# Patient Record
Sex: Female | Born: 1975 | Race: White | Hispanic: No | Marital: Married | Smoking: Never smoker
Health system: Southern US, Community
[De-identification: ages and names within clinical notes are randomized; demographics above are authoritative.]

## PROBLEM LIST (undated history)

## (undated) DIAGNOSIS — K589 Irritable bowel syndrome without diarrhea: Secondary | ICD-10-CM

## (undated) HISTORY — DX: Irritable bowel syndrome without diarrhea: K58.9

---

## 2012-11-17 ENCOUNTER — Emergency Department (HOSPITAL_BASED_OUTPATIENT_CLINIC_OR_DEPARTMENT_OTHER)
Admission: EM | Admit: 2012-11-17 | Discharge: 2012-11-17 | Disposition: A | Discharge status: Home / Self Care | Payer: Worker's Compensation | Attending: Emergency Medicine | Admitting: Emergency Medicine

## 2012-11-17 ENCOUNTER — Encounter (HOSPITAL_BASED_OUTPATIENT_CLINIC_OR_DEPARTMENT_OTHER): Payer: Self-pay | Admitting: Emergency Medicine

## 2012-11-17 ENCOUNTER — Emergency Department (HOSPITAL_BASED_OUTPATIENT_CLINIC_OR_DEPARTMENT_OTHER): Payer: Worker's Compensation

## 2012-11-17 DIAGNOSIS — W010XXA Fall on same level from slipping, tripping and stumbling without subsequent striking against object, initial encounter: Secondary | ICD-10-CM | POA: Insufficient documentation

## 2012-11-17 DIAGNOSIS — S5011XA Contusion of right forearm, initial encounter: Secondary | ICD-10-CM

## 2012-11-17 DIAGNOSIS — Y939 Activity, unspecified: Secondary | ICD-10-CM | POA: Insufficient documentation

## 2012-11-17 DIAGNOSIS — Y929 Unspecified place or not applicable: Secondary | ICD-10-CM | POA: Insufficient documentation

## 2012-11-17 DIAGNOSIS — S5010XA Contusion of unspecified forearm, initial encounter: Secondary | ICD-10-CM | POA: Insufficient documentation

## 2012-11-17 NOTE — ED Notes (Signed)
Pt fell on ice.  Pt c/o pain right arm and elbow.  No LOC.

## 2012-11-17 NOTE — ED Provider Notes (Signed)
History     CSN: [id number]  Arrival date & time 11/17/12  1191   First MD Initiated Contact with Patient 11/17/12 [phone]      Chief Complaint  Patient presents with  . Fall    (Consider location/radiation/quality/duration/timing/severity/associated sxs/prior treatment) Patient is a 37 y.o. female presenting with fall. The history is provided by the patient.  Fall   patient complains of right elbow pain after slipping on ice this morning. No loss of consciousness. Pain is dull and worse with movement or palpation. Full range of motion at the elbow and patient notes some pain distal to her elbow. No numbness or tingling in her hand. No treatment used prior to arrival and symptoms have been persistent  No past medical history on file.  No past surgical history on file.  No family history on file.  History  Substance Use Topics  . Smoking status: Not on file  . Smokeless tobacco: Not on file  . Alcohol Use: Not on file    OB History   Grav Para Term Preterm Abortions TAB SAB Ect Mult Living                  Review of Systems  All other systems reviewed and are negative.    Allergies  Review of patient's allergies indicates no known allergies.  Home Medications   Current Outpatient Rx  Name  Route  Sig  Dispense  Refill  . norethindrone-ethinyl estradiol (JUNEL FE,GILDESS FE,LOESTRIN FE) 1-20 MG-MCG tablet   Oral   Take 1 tablet by mouth daily.           BP 124/81  Pulse 81  Temp(Src) 97.9 F (36.6 C) (Oral)  Resp 16  Ht 5\' 2"  (1.575 m)  Wt 225 lb (102.059 kg)  BMI 41.14 kg/m2  SpO2 99%  LMP 11/05/2012  Physical Exam  Nursing note and vitals reviewed. Constitutional: She is oriented to person, place, and time. She appears well-developed and well-nourished.  Non-toxic appearance. No distress.  HENT:  Head: Normocephalic and atraumatic.  Eyes: Conjunctivae, EOM and lids are normal. Pupils are equal, round, and reactive to light.  Neck: Normal range  of motion. Neck supple. No tracheal deviation present. No mass present.  Cardiovascular: Normal rate, regular rhythm and normal heart sounds.  Exam reveals no gallop.   No murmur heard. Pulmonary/Chest: Effort normal and breath sounds normal. No stridor. No respiratory distress. She has no decreased breath sounds. She has no wheezes. She has no rhonchi. She has no rales.  Abdominal: Soft. Normal appearance and bowel sounds are normal. She exhibits no distension. There is no tenderness. There is no rebound and no CVA tenderness.  Musculoskeletal: Normal range of motion. She exhibits no edema and no tenderness.       Right elbow: She exhibits no swelling and no deformity.       Arms: Neurological: She is alert and oriented to person, place, and time. She has normal strength. No cranial nerve deficit or sensory deficit. GCS eye subscore is 4. GCS verbal subscore is 5. GCS motor subscore is 6.  Skin: Skin is warm and dry. No abrasion and no rash noted.  Psychiatric: She has a normal mood and affect. Her speech is normal and behavior is normal.    ED Course  Procedures (including critical care time)  Labs Reviewed - No data to display No results found.   No diagnosis found.    MDM  X-rays negative. Patient with likely contusion  Toy Baker, MD 11/17/12 [phone]

## 2019-07-08 ENCOUNTER — Ambulatory Visit
Admission: EM | Admit: 2019-07-08 | Discharge: 2019-07-08 | Disposition: A | Discharge status: Home / Self Care | Payer: Commercial Managed Care - PPO | Attending: Family Medicine | Admitting: Family Medicine

## 2019-07-08 DIAGNOSIS — K591 Functional diarrhea: Secondary | ICD-10-CM | POA: Diagnosis not present

## 2019-07-08 MED ORDER — LOPERAMIDE HCL 2 MG PO CAPS: 2.0000 mg | ORAL_CAPSULE | Freq: Three times a day (TID) | ORAL | 0 refills | Status: AC

## 2019-07-08 MED ORDER — OXYMETAZOLINE HCL 0.05 % NA SOLN: 1.0000 | Freq: Two times a day (BID) | NASAL | Status: DC

## 2019-07-08 MED ORDER — DICYCLOMINE HCL 20 MG PO TABS: 20.0000 mg | ORAL_TABLET | Freq: Three times a day (TID) | ORAL | 0 refills | Status: AC

## 2019-07-08 MED ORDER — OXYMETAZOLINE HCL 0.05 % NA SOLN
2.0000 | Freq: Two times a day (BID) | NASAL | Status: DC
  Administered 2019-07-08: 2 via NASAL

## 2019-07-08 NOTE — ED Triage Notes (Signed)
Pt c/o diarrhea off and on for the past 27months. Last episode a week ago. No complaints at this time.

## 2019-07-08 NOTE — Discharge Instructions (Signed)
Please follow up with gastroenterology for further evaluation of diarrhea May try bentyl as needed for cramping/bloating Loperamide 2 mg 45 minutes before meals. Please read attached about FOD MAP diet, consider trying to eliminate these foods from diet  Follow up if developing suddenly worsening abdominal pain

## 2019-07-08 NOTE — ED Provider Notes (Signed)
[facility]    CSN: [id number] Arrival date & time: 07/08/19  1616      History   Chief Complaint Chief Complaint  Patient presents with  . Diarrhea    HPI Laura Newton is a 43 y.o. female no significant past medical history presenting today for evaluation of diarrhea.  Patient states that back in 2016 she struggled with constipation.  At the time she was seen by [facility] and had ultrasound and blood work.  Her symptoms were attributed to IBS and she was started on Linzess.  In 2018 she went on a cruise and started to have occasional episodes of diarrhea which she would often never noticed would happen when she goes out of town.  Over the past couple months she has also had occasional episodes of diarrhea.  Typically will wake her up at night and she will have frequent bowels for a few hours.  She will on rare occasions have associated vomiting, but notices this mainly when she has more pressure/gas sensation in her abdomen.  Has relatively mild associated abdominal pain.  Denies blood in stool.  Denies known family history of any GI issues or colon cancer.  Denies weight loss or night sweats.  Last episode of diarrhea was over 1 week ago.  Currently without abdominal pain.  Has noticed symptoms worse after eating [place] foods, occasionally symptoms seem to be situational.  HPI  History reviewed. No pertinent past medical history.  There are no active problems to display for this patient.   History reviewed. No pertinent surgical history.  OB History   No obstetric history on file.      Home Medications    Prior to Admission medications   Medication Sig Start Date End Date Taking? Authorizing Provider  dicyclomine (BENTYL) 20 MG tablet Take 1 tablet (20 mg total) by mouth 4 (four) times daily -  before meals and at bedtime. As needed for cramping/bloating 07/08/19   Deanna Boehlke, [employer] C, PA-C  loperamide (IMODIUM) 2 MG capsule Take 1 capsule (2 mg  total) by mouth 4 (four) times daily -  before meals and at bedtime. 07/08/19   Keene Gilkey C, PA-C  norethindrone-ethinyl estradiol (JUNEL FE,GILDESS FE,LOESTRIN FE) 1-20 MG-MCG tablet Take 1 tablet by mouth daily.    [provider]    Family History No family history on file.  Social History Social History   Tobacco Use  . Smoking status: Never Smoker  . Smokeless tobacco: Never Used  Substance Use Topics  . Alcohol use: Not Currently  . Drug use: Not on file     Allergies   Patient has no known allergies.   Review of Systems Review of Systems  Constitutional: Negative for fever.  Respiratory: Negative for shortness of breath.   Cardiovascular: Negative for chest pain.  Gastrointestinal: Positive for abdominal pain, diarrhea and nausea. Negative for vomiting.  Genitourinary: Positive for dysuria and vaginal discharge. Negative for flank pain, genital sores, hematuria, menstrual problem, vaginal bleeding and vaginal pain.  Musculoskeletal: Negative for back pain.  Skin: Negative for rash.  Neurological: Negative for dizziness, light-headedness and headaches.     Physical Exam Triage Vital Signs ED Triage Vitals [07/08/19 1625]  Enc Vitals Group     BP 125/89     Pulse Rate 91     Resp 18     Temp 98.1 F (36.7 C)     Temp Source Oral     SpO2 98 %  Weight      Height      Head Circumference      Peak Flow      Pain Score 0     Pain Loc      Pain Edu?      Excl. in GC?    No data found.  Updated Vital Signs BP 125/89 (BP Location: Left Arm)   Pulse 91   Temp 98.3 F (36.8 C) (Oral)   Resp 18   LMP 06/28/2019   SpO2 98%   Visual Acuity Right Eye Distance:   Left Eye Distance:   Bilateral Distance:    Right Eye Near:   Left Eye Near:    Bilateral Near:     Physical Exam Vitals signs and nursing note reviewed.  Constitutional:      General: She is not in acute distress.    Appearance: She is well-developed.  HENT:      Head: Normocephalic and atraumatic.  Eyes:     Conjunctiva/sclera: Conjunctivae normal.  Neck:     Musculoskeletal: Neck supple.  Cardiovascular:     Rate and Rhythm: Normal rate and regular rhythm.     Heart sounds: No murmur.  Pulmonary:     Effort: Pulmonary effort is normal. No respiratory distress.     Breath sounds: Normal breath sounds.     Comments: Breathing comfortably at rest, CTABL, no wheezing, rales or other adventitious sounds auscultated Abdominal:     Palpations: Abdomen is soft.     Tenderness: There is no abdominal tenderness.     Comments: Soft, nondistended, nontender to light and deep palpation throughout entire abdomen  Skin:    General: Skin is warm and dry.  Neurological:     Mental Status: She is alert.      UC Treatments / Results  Labs (all labs ordered are listed, but only abnormal results are displayed) Labs Reviewed - No data to display  EKG   Radiology No results found.  Procedures Procedures (including critical care time)  Medications Ordered in UC Medications - No data to display  Initial Impression / Assessment and Plan / UC Course  I have reviewed the triage vital signs and the nursing notes.  Pertinent labs & imaging results that were available during my care of the patient were reviewed by me and considered in my medical decision making (see chart for details).     Patient with episodic diarrhea over the past few months-years.  Likely more functional instead of infectious.  Symptoms most suggestive of IBS versus food intolerance.  Discussed following up with GI, discussed trial of FODMAP diet, may use Imodium as needed for diarrhea, Bentyl as needed for cramping/bloating.  Discussed strict return precautions. Patient verbalized understanding and is agreeable with plan.  Final Clinical Impressions(s) / UC Diagnoses   Final diagnoses:  Functional diarrhea     Discharge Instructions     Please follow up with  gastroenterology for further evaluation of diarrhea May try bentyl as needed for cramping/bloating Loperamide 2 mg 45 minutes before meals. Please read attached about FOD MAP diet, consider trying to eliminate these foods from diet  Follow up if developing suddenly worsening abdominal pain    ED Prescriptions    Medication Sig Dispense Auth. Provider   dicyclomine (BENTYL) 20 MG tablet Take 1 tablet (20 mg total) by mouth 4 (four) times daily -  before meals and at bedtime. As needed for cramping/bloating 20 tablet Inessa Wardrop, [place] C, PA-C  loperamide (IMODIUM) 2 MG capsule Take 1 capsule (2 mg total) by mouth 4 (four) times daily -  before meals and at bedtime. 24 capsule Jilliam Bellmore, [place] C, PA-C     PDMP not reviewed this encounter.   Lew DawesWieters, Fatemah Pourciau C, [place]PA-C 07/08/19 1907

## 2019-07-18 ENCOUNTER — Other Ambulatory Visit (INDEPENDENT_AMBULATORY_CARE_PROVIDER_SITE_OTHER): Payer: Commercial Managed Care - PPO

## 2019-07-18 ENCOUNTER — Ambulatory Visit: Payer: Commercial Managed Care - PPO | Admitting: Physician Assistant

## 2019-07-18 ENCOUNTER — Other Ambulatory Visit: Payer: Self-pay

## 2019-07-18 ENCOUNTER — Telehealth: Payer: Self-pay | Admitting: Physician Assistant

## 2019-07-18 ENCOUNTER — Encounter: Payer: Self-pay | Admitting: Physician Assistant

## 2019-07-18 VITALS — BP 116/80 | HR 76 | Temp 98.6°F | Ht 60.5 in | Wt 218.2 lb

## 2019-07-18 DIAGNOSIS — R197 Diarrhea, unspecified: Secondary | ICD-10-CM | POA: Diagnosis not present

## 2019-07-18 DIAGNOSIS — R1011 Right upper quadrant pain: Secondary | ICD-10-CM

## 2019-07-18 DIAGNOSIS — R112 Nausea with vomiting, unspecified: Secondary | ICD-10-CM

## 2019-07-18 LAB — CBC WITH DIFFERENTIAL/PLATELET
Basophils Absolute: 0.1 10*3/uL (ref 0.0–0.1)
Basophils Relative: 1.1 % (ref 0.0–3.0)
Eosinophils Absolute: 0 10*3/uL (ref 0.0–0.7)
Eosinophils Relative: 0.6 % (ref 0.0–5.0)
HCT: 38.7 % (ref 36.0–46.0)
Hemoglobin: 12.8 g/dL (ref 12.0–15.0)
Lymphocytes Relative: 42.6 % (ref 12.0–46.0)
Lymphs Abs: 2.4 10*3/uL (ref 0.7–4.0)
MCHC: 33.1 g/dL (ref 30.0–36.0)
MCV: 88.9 fl (ref 78.0–100.0)
Monocytes Absolute: 0.3 10*3/uL (ref 0.1–1.0)
Monocytes Relative: 5.3 % (ref 3.0–12.0)
Neutro Abs: 2.8 10*3/uL (ref 1.4–7.7)
Neutrophils Relative %: 50.4 % (ref 43.0–77.0)
Platelets: 329 10*3/uL (ref 150.0–400.0)
RBC: 4.36 Mil/uL (ref 3.87–5.11)
RDW: 13 % (ref 11.5–15.5)
WBC: 5.6 10*3/uL (ref 4.0–10.5)

## 2019-07-18 LAB — LIPASE: Lipase: 17 U/L (ref 11.0–59.0)

## 2019-07-18 LAB — COMPREHENSIVE METABOLIC PANEL
ALT: 9 U/L (ref 0–35)
AST: 13 U/L (ref 0–37)
Albumin: 4.1 g/dL (ref 3.5–5.2)
Alkaline Phosphatase: 61 U/L (ref 39–117)
BUN: 11 mg/dL (ref 6–23)
CO2: 25 mEq/L (ref 19–32)
Calcium: 9 mg/dL (ref 8.4–10.5)
Chloride: 105 mEq/L (ref 96–112)
Creatinine, Ser: 0.86 mg/dL (ref 0.40–1.20)
GFR: 72.74 mL/min (ref >60.00)
Glucose, Bld: 103 mg/dL — ABNORMAL HIGH (ref 70–99)
Potassium: 3.6 mEq/L (ref 3.5–5.1)
Sodium: 138 mEq/L (ref 135–145)
Total Bilirubin: 0.3 mg/dL (ref 0.2–1.2)
Total Protein: 7.4 g/dL (ref 6.0–8.3)

## 2019-07-18 NOTE — Progress Notes (Signed)
Chief Complaint: Recurrent diarrhea, nausea,vomiting  HPI:    Laura Newton is a 43 year old female with a past medical history as listed below, who was referred to me by Lew Dawes, PA-C for a complaint of recurrent diarrhea, nausea, vomiting.      07/08/2019 patient seen in the urgent care for functional diarrhea.  At that time described that back in 2016 she struggled with constipation and symptoms were attributed to IBS and she is started on Linzess.  In 2018 she went on a cruise and had several episodes of diarrhea and then over the past couple of months it also had occasional episodes of diarrhea which would wake her up at night and she will have frequent stools for a few hours, on rare occasions associated with vomiting but notices this mainly when she has more pressure/gas sensation in her abdomen, relatively mild associated abdominal pain.  That time discussed patient have episodic diarrhea over the past few months-years, thought more likely functional instead of infectious.  They discussed trial of FODMAP diet and Imodium as needed as well as Bentyl for cramping/bloating.    Today, the patient presents to clinic and explains that in 2018 she got sick on a cruise ship with vomiting and diarrhea but then when she came home this continued for the next 1 to 2 weeks.  Apparently had a work-up at that time including an ultrasound and labs but was never told the results of these.  She did not have another problem with that until now, at the beginning of the year it all started again and now every week or every other week or maybe every third week she will have an episode where she wakes up in the middle of the night with 8-9/10 abdominal cramping pain which sends her to the bathroom.  At first she will have a regular solid stool but then this is followed by watery stool for the next 1 to 2 hours.  Accompanied with this is nausea and multiple episodes of vomiting.  Patient tells me the pain almost  feels like an air bubble and pressure that is moving through her gut.  Does often start at the top of her abdomen and then radiate downward.  Has noticed that this is typically after she eats out, has happened multiple times after eating [place] food and one time after eating burgers.  After the few hours when this is going on a will stop and she will be fine the next day.    Denies fever, chills, weight loss, blood in her stool or frequent reflux symptoms.  Past Medical History:  Diagnosis Date   IBS (irritable bowel syndrome)     History reviewed. No pertinent surgical history.  Current Outpatient Medications  Medication Sig Dispense Refill   norethindrone-ethinyl estradiol (JUNEL FE,GILDESS FE,LOESTRIN FE) 1-20 MG-MCG tablet Take 1 tablet by mouth daily.     dicyclomine (BENTYL) 20 MG tablet Take 1 tablet (20 mg total) by mouth 4 (four) times daily -  before meals and at bedtime. As needed for cramping/bloating (Patient not taking: Reported on 07/18/2019) 20 tablet 0   loperamide (IMODIUM) 2 MG capsule Take 1 capsule (2 mg total) by mouth 4 (four) times daily -  before meals and at bedtime. (Patient not taking: Reported on 07/18/2019) 24 capsule 0   No current facility-administered medications for this visit.     Allergies as of 07/18/2019   (No Known Allergies)    Family History  Problem Relation Age  of Onset   Diabetes Sister     Social History   Socioeconomic History   Marital status: Married    Spouse name: Not on file   Number of children: 0   Years of education: Not on file   Highest education level: Not on file  Occupational History   Occupation: Dialysis Tech  Social Needs   Financial resource strain: Not on file   Food insecurity    Worry: Not on file    Inability: Not on file   Transportation needs    Medical: Not on file    Non-medical: Not on file  Tobacco Use   Smoking status: Never Smoker   Smokeless tobacco: Never Used  Substance and  Sexual Activity   Alcohol use: Not Currently   Drug use: Not on file   Sexual activity: Not on file  Lifestyle   Physical activity    Days per week: Not on file    Minutes per session: Not on file   Stress: Not on file  Relationships   Social connections    Talks on phone: Not on file    Gets together: Not on file    Attends religious service: Not on file    Active member of club or organization: Not on file    Attends meetings of clubs or organizations: Not on file    Relationship status: Not on file   Intimate partner violence    Fear of current or ex partner: Not on file    Emotionally abused: Not on file    Physically abused: Not on file    Forced sexual activity: Not on file  Other Topics Concern   Not on file  Social History Narrative   Not on file    Review of Systems:    Constitutional: No weight loss, fever or chills Skin: No rash  Cardiovascular: No chest pain Respiratory: No SOB Gastrointestinal: See HPI and otherwise negative Genitourinary: No dysuria  Neurological: No headache, dizziness or syncope Musculoskeletal: No new muscle or joint pain Hematologic: No bleeding Psychiatric: No history of depression or anxiety   Physical Exam:  Vital signs: BP 116/80 (BP Location: Left Arm, Patient Position: Sitting, Cuff Size: Normal)    Pulse 76    Temp 98.6 F (37 C)    Ht 5' 0.5" (1.537 m) Comment: height measured without shoes   Wt 218 lb 4 oz (99 kg)    LMP 07/16/2019    BMI 41.92 kg/m   Constitutional:   Pleasant obese female appears to be in NAD, Well developed, Well nourished, alert and cooperative Head:  Normocephalic and atraumatic. Eyes:   PEERL, EOMI. No icterus. Conjunctiva pink. Ears:  Normal auditory acuity. Neck:  Supple Throat: Oral cavity and pharynx without inflammation, swelling or lesion.  Respiratory: Respirations even and unlabored. Lungs clear to auscultation bilaterally.   No wheezes, crackles, or rhonchi.  Cardiovascular:  Normal S1, S2. No MRG. Regular rate and rhythm. No peripheral edema, cyanosis or pallor.  Gastrointestinal:  Soft, nondistended, nontender. No rebound or guarding. Normal bowel sounds. No appreciable masses or hepatomegaly. Rectal:  Not performed.  Msk:  Symmetrical without gross deformities. Without edema, no deformity or joint abnormality.  Neurologic:  Alert and  oriented x4;  grossly normal neurologically.  Skin:   Dry and intact without significant lesions or rashes. Psychiatric: Demonstrates good judgement and reason without abnormal affect or behaviors.  No recent labs or imaging.  Assessment: 1.  Right upper quadrant abdominal pain:  Patient states abdominal pain starts in her right upper quadrant during these episodes and radiates throughout her abdomen, typically after a fatty meal; concern for gallbladder etiology versus gastritis versus pancreatitis versus colitis versus IBS 2.  Diarrhea: With episodes above  3.  Nausea and vomiting: With episodes above  Plan: 1.  Discussed with patient that it sounds as though she is having episodes of symptomatic cholelithiasis.  Ordered a right upper quadrant ultrasound, CBC, CMP and lipase for further evaluation. 2.  Explained that if above is normal could consider a HIDA scan versus EGD/colonoscopy for further work-up. 3.  Advised the patient to maintain a low-fat diet for now 4.  Patient to follow in clinic for recommendations from us after testing above.  She was assigned to Dr. Marina GoodellPerry this afternoon.  Hyacinth MeekerJennifer Lenny Bouchillon, PA-C [facility] 07/18/2019, 1:53 PM  Cc: Wieters, ReaganHallie C, PA-C

## 2019-07-18 NOTE — Patient Instructions (Signed)
If you are age 41 or older, your body mass index should be between 23-30. Your Body mass index is 41.92 kg/m. If this is out of the aforementioned range listed, please consider follow up with your Primary Care Provider.  If you are age 37 or younger, your body mass index should be between 19-25. Your Body mass index is 41.92 kg/m. If this is out of the aformentioned range listed, please consider follow up with your Primary Care Provider.   Your provider has requested that you go to the basement level for lab work before leaving today. Press "B" on the elevator. The lab is located at the first door on the left as you exit the elevator.   You have been scheduled for an abdominal ultrasound at [facility] (1st floor of hospital) on Wednesday 07/24/2019 at 8 am. Please arrive 15 minutes prior to your appointment for registration. Make certain not to have anything to eat or drink 6 hours prior to your appointment. Should you need to reschedule your appointment, please contact radiology at [phone]. This test typically takes about 30 minutes to perform.

## 2019-07-18 NOTE — Progress Notes (Signed)
Assessment and plan noted ?

## 2019-07-19 NOTE — Telephone Encounter (Signed)
Called and left message to please keep her [place] appt. For 07/23/19 and to please call back if any other questions

## 2019-07-23 ENCOUNTER — Other Ambulatory Visit: Payer: Self-pay

## 2019-07-23 ENCOUNTER — Ambulatory Visit (HOSPITAL_COMMUNITY)
Admission: RE | Admit: 2019-07-23 | Discharge: 2019-07-23 | Disposition: A | Discharge status: Home / Self Care | Payer: Commercial Managed Care - PPO | Source: Ambulatory Visit | Attending: Physician Assistant | Admitting: Physician Assistant

## 2019-07-23 ENCOUNTER — Telehealth: Payer: Self-pay

## 2019-07-23 DIAGNOSIS — R1011 Right upper quadrant pain: Secondary | ICD-10-CM | POA: Insufficient documentation

## 2019-07-23 DIAGNOSIS — R112 Nausea with vomiting, unspecified: Secondary | ICD-10-CM | POA: Diagnosis present

## 2019-07-23 DIAGNOSIS — R197 Diarrhea, unspecified: Secondary | ICD-10-CM | POA: Insufficient documentation

## 2019-07-23 DIAGNOSIS — R935 Abnormal findings on diagnostic imaging of other abdominal regions, including retroperitoneum: Secondary | ICD-10-CM

## 2019-07-23 NOTE — Telephone Encounter (Signed)
Pt returned your call and would like a call back. She is also requesting to speak with Anderson Malta, she would like to know if her ct scan can wait until January of next year or if it is urgent. She would a like a better understanding of her situation and the treatment plan.

## 2019-07-23 NOTE — Telephone Encounter (Signed)
Spoke to patient and she would like a more detailed explanation of what was seen on her [place] before she has a CT. Requesting to speak to Laura Newer PA.

## 2019-07-23 NOTE — Telephone Encounter (Signed)
Left message for patient to please call back. 

## 2019-07-24 ENCOUNTER — Ambulatory Visit (HOSPITAL_COMMUNITY): Payer: Commercial Managed Care - PPO

## 2019-07-25 NOTE — Telephone Encounter (Signed)
Tried calling the patient this  Morning at 0900, no answer, sent her a mychart message to answer some questions. [name]

## 2020-05-10 IMAGING — US US ABDOMEN LIMITED
1 series · 14 of 25 positions shown · non-contrast
Comparison: None.

CLINICAL DATA: 42-year-old female with right upper quadrant
abdominal pain, nausea vomiting.

EXAM:
ULTRASOUND ABDOMEN LIMITED RIGHT UPPER QUADRANT

[Series 1: us abdomen limited · 14 of 54 slices shown]
[im 1/54]
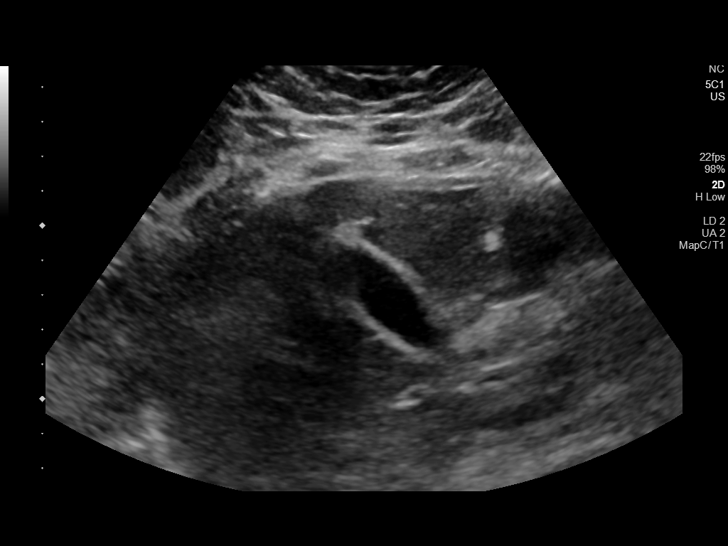
[im 5/54]
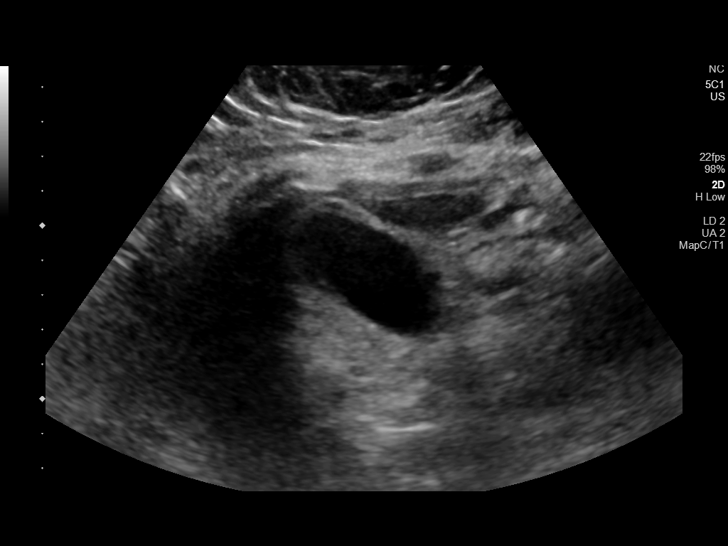
[im 9/54]
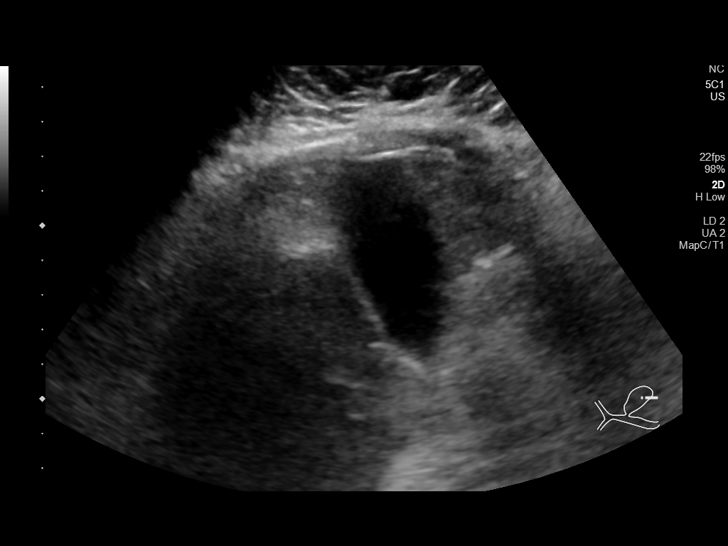
[im 14/54]
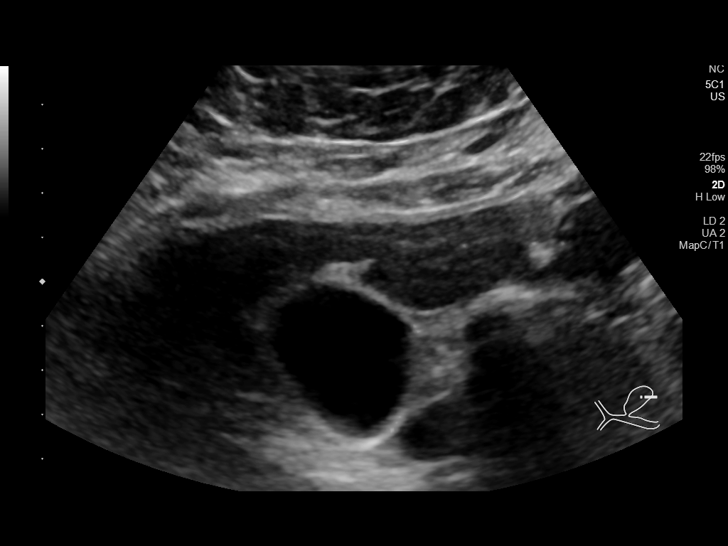
[im 18/54]
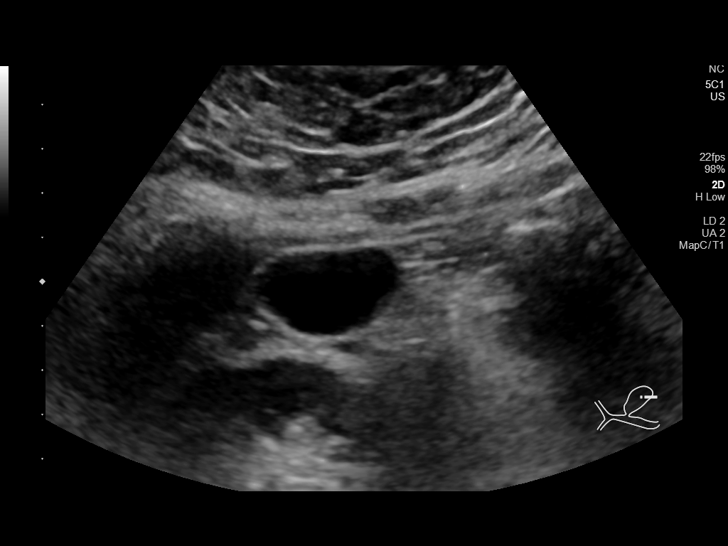
[im 20/54]
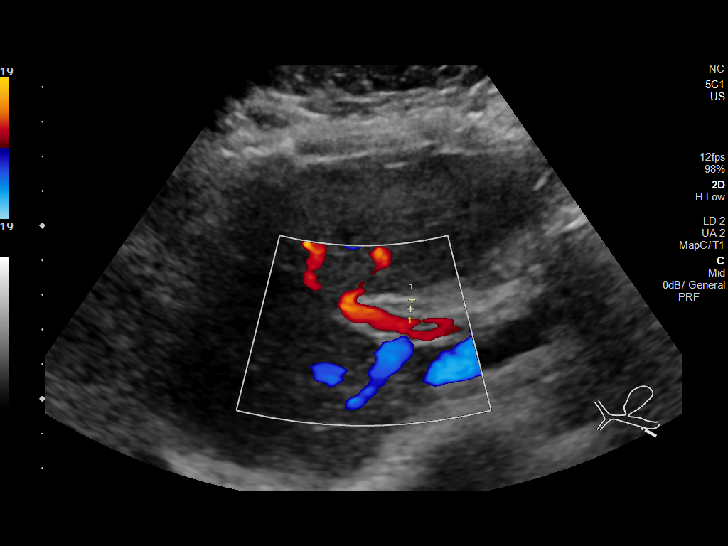
[im 25/54]
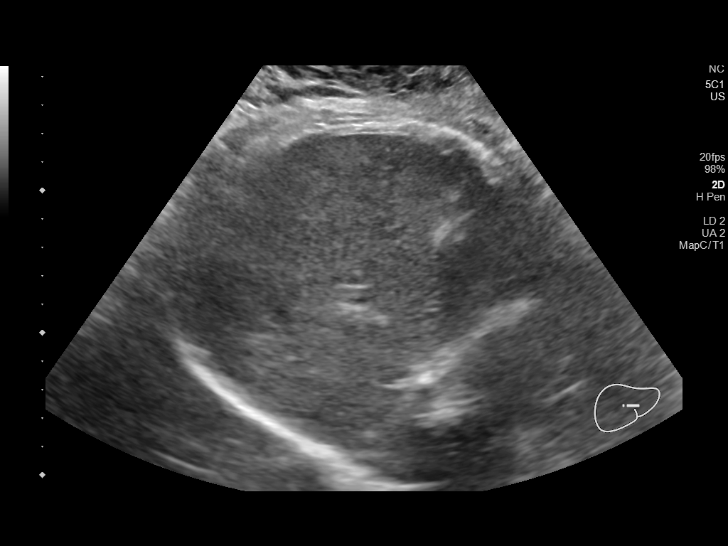
[im 29/54]
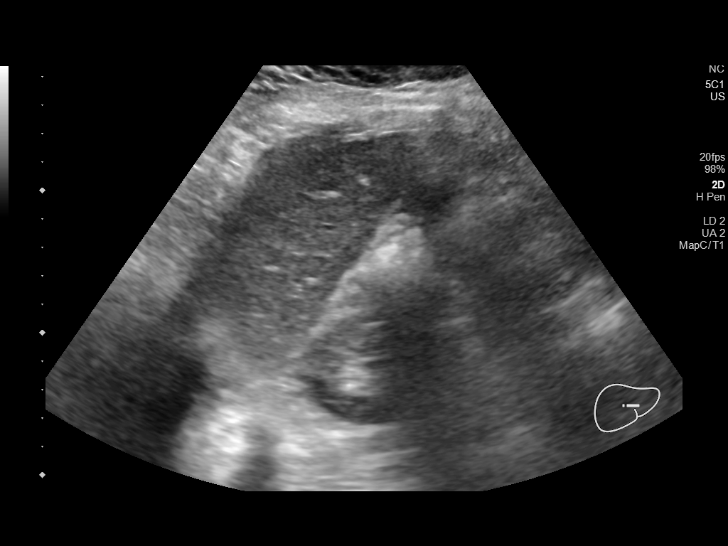
[im 34/54]
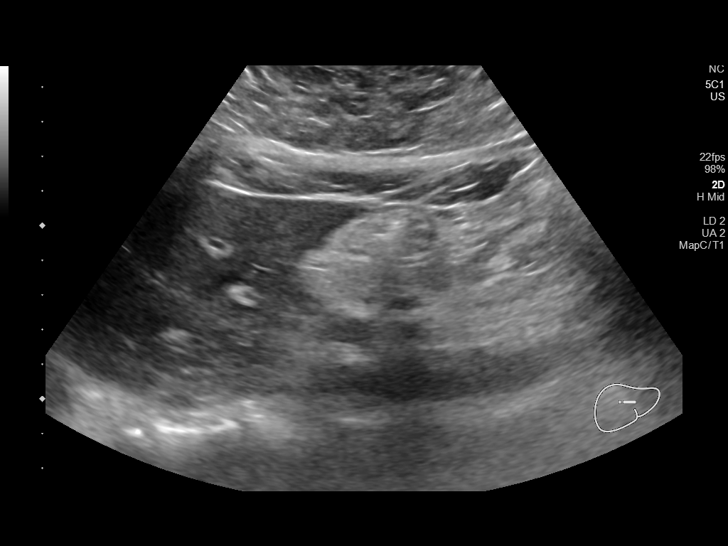
[im 36/54]
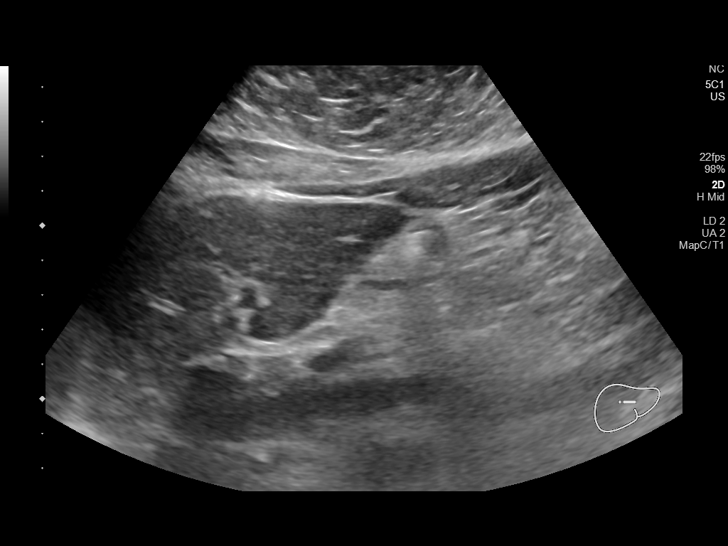
[im 40/54]
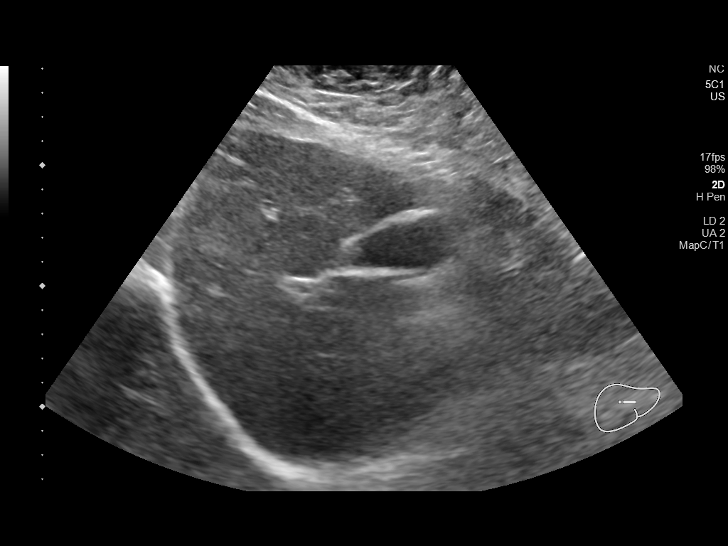
[im 45/54]
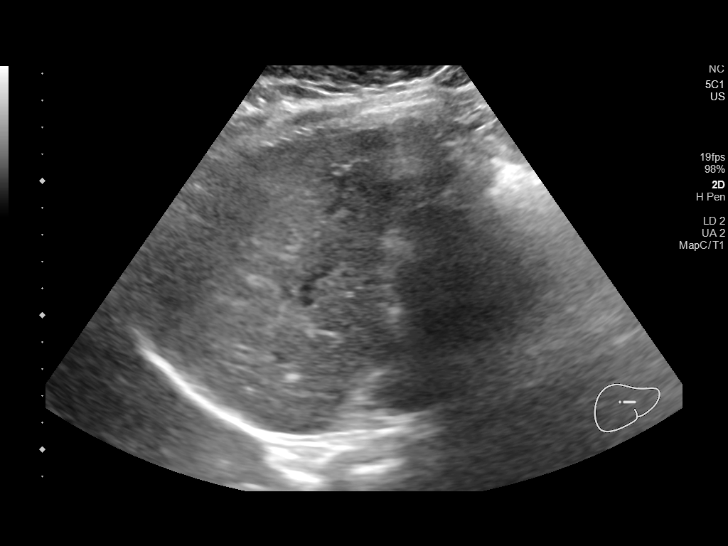
[im 49/54]
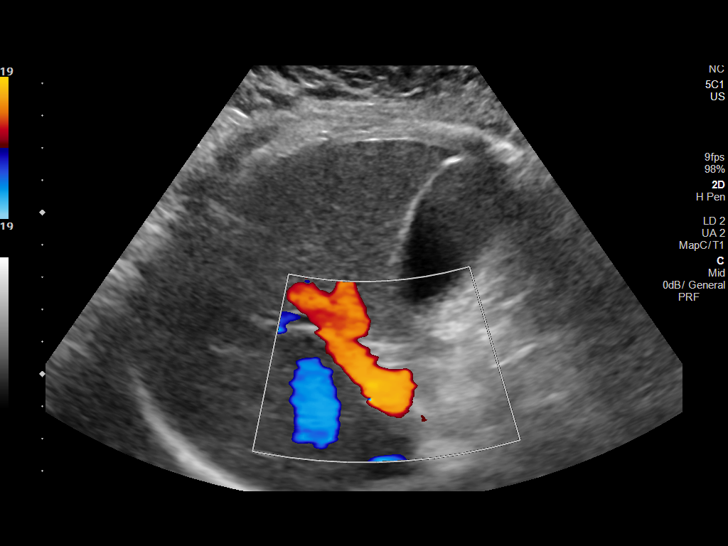
[im 54/54]
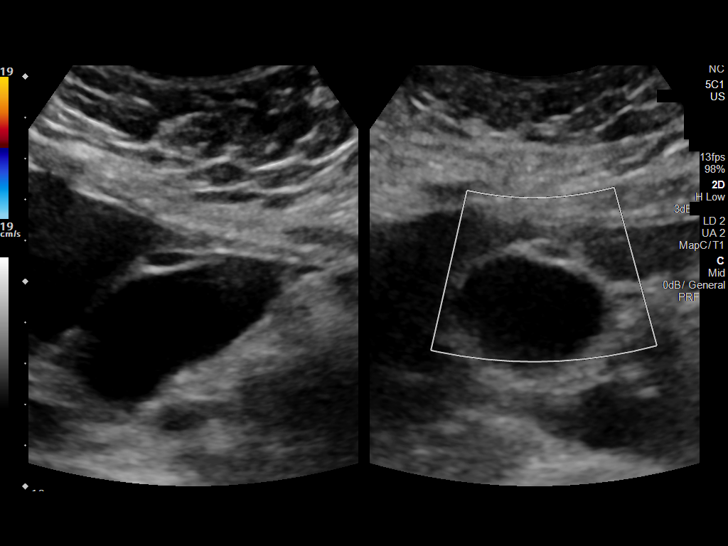

[14 of 25 positions shown; findings below may reference images not displayed]

FINDINGS: Gallbladder:

There is no gallstone, gallbladder wall thickening or
pericholecystic fluid. Negative sonographic Murphy's sign. There is
a 1.5 x 0.4 x 1.1 cm anechoic area anterior to the gallbladder which
may represent a small chronic fluid collection, or an area of fold
or outpouching from the gallbladder versus less likely a focal
adenomyomatosis. Attention on follow-up imaging recommended.

Common bile duct:

Diameter: 3 mm

Liver:

The liver is unremarkable. Portal vein is patent on color Doppler
imaging with normal direction of blood flow towards the liver.

Other: None.
IMPRESSION: 1. No gallstones or sonographic findings of acute cholecystitis.
2. A small chronic and loculated fluid along the gallbladder wall
versus a small gallbladder diverticulum. Attention on follow-up
imaging recommended.

## 2022-12-17 ENCOUNTER — Encounter (HOSPITAL_BASED_OUTPATIENT_CLINIC_OR_DEPARTMENT_OTHER): Payer: Self-pay | Admitting: Pediatrics

## 2022-12-17 ENCOUNTER — Other Ambulatory Visit: Payer: Self-pay

## 2022-12-17 ENCOUNTER — Emergency Department (HOSPITAL_BASED_OUTPATIENT_CLINIC_OR_DEPARTMENT_OTHER)
Admission: EM | Admit: 2022-12-17 | Discharge: 2022-12-17 | Disposition: A | Discharge status: Home / Self Care | Payer: Commercial Managed Care - PPO | Attending: Emergency Medicine | Admitting: Emergency Medicine

## 2022-12-17 DIAGNOSIS — H9201 Otalgia, right ear: Secondary | ICD-10-CM | POA: Diagnosis present

## 2022-12-17 NOTE — ED Notes (Signed)
Discharge paperwork reviewed entirely with patient, including Rx's and follow up care. Pain was under control. Pt verbalized understanding as well as all parties involved. No questions or concerns voiced at the time of discharge. No acute distress noted.   Pt ambulated out to PVA without incident or assistance.  

## 2022-12-17 NOTE — ED Provider Notes (Signed)
[facility] Provider Note   CSN: [id number] Arrival date & time: 12/17/22  1544     History  Chief Complaint  Patient presents with   Posterior Ear Pain    Laura Newton is a 47 y.o. female.  47 y.o female with no PMH presents to the ED with a chief complaint of pain behind the right ear that is been ongoing for about a month.  She reports she noticed that there was a difference between the right and the left posterior aspect of her ear, now reports that last night she felt pain to the area, throbbing pain that radiates down her right shoulder.  She has not applied anything, no alleviating factors.  Exacerbated with palpation.  No recent ear infections, no upper respiratory infections, no fevers.  No headaches.  The history is provided by the patient.       Home Medications Prior to Admission medications   Medication Sig Start Date End Date Taking? Authorizing Provider  dicyclomine (BENTYL) 20 MG tablet Take 1 tablet (20 mg total) by mouth 4 (four) times daily -  before meals and at bedtime. As needed for cramping/bloating Patient not taking: Reported on 07/18/2019 07/08/19   Wieters, Fran LowesHallie C, PA-C  loperamide (IMODIUM) 2 MG capsule Take 1 capsule (2 mg total) by mouth 4 (four) times daily -  before meals and at bedtime. Patient not taking: Reported on 07/18/2019 07/08/19   Wieters, Fran LowesHallie C, PA-C  norethindrone-ethinyl estradiol (JUNEL FE,GILDESS FE,LOESTRIN FE) 1-20 MG-MCG tablet Take 1 tablet by mouth daily.    [provider]      Allergies    Patient has no known allergies.    Review of Systems   Review of Systems  Constitutional:  Negative for fever.  HENT:  Negative for congestion, ear discharge, ear pain and hearing loss.   Eyes:  Negative for pain.    Physical Exam Updated Vital Signs BP (!) 146/76 (BP Location: Left Arm)   Pulse 83   Temp 98.4 F (36.9 C) (Oral)   Resp 18   Ht 5\' 2"  (1.575 m)   Wt  86.2 kg   LMP 12/03/2022 (Exact Date)   SpO2 99%   BMI 34.75 kg/m  Physical Exam Vitals and nursing note reviewed.  Constitutional:      Appearance: Normal appearance.  HENT:     Head: Normocephalic and atraumatic.     Right Ear: Tympanic membrane normal. No tenderness. No mastoid tenderness. Tympanic membrane is not perforated or retracted.     Left Ear: Tympanic membrane normal. No tenderness. No mastoid tenderness. Tympanic membrane is not perforated or retracted.     Ears:      Comments: Bilateral TMs without any tenderness, no injection, no signs of perforation.  No mastoid tenderness bilaterally.    Mouth/Throat:     Mouth: Mucous membranes are moist.  Cardiovascular:     Rate and Rhythm: Normal rate.  Pulmonary:     Effort: Pulmonary effort is normal.  Abdominal:     General: Abdomen is flat.  Musculoskeletal:     Cervical back: Normal range of motion and neck supple.  Skin:    General: Skin is warm and dry.  Neurological:     Mental Status: She is alert and oriented to person, place, and time.     ED Results / Procedures / Treatments   Labs (all labs ordered are listed, but only abnormal results are displayed) Labs Reviewed -  No data to display  EKG None  Radiology No results found.  Procedures Procedures    Medications Ordered in ED Medications - No data to display  ED Course/ Medical Decision Making/ A&P                             Medical Decision Making   Patient here with pain behind her right ear that is been ongoing for some time, she reports that now she noticed that the right side of the posterior aspect of her ear was worse than the left.  Evaluation bilateral TMs are without any injection, no tenderness to the tragus.  No signs of infection.  There is no erythema, redness of the skin or fluctuance to suggest mastoiditis.  She has not been sick with any upper respiratory infections.  She has not had any fevers.  Orts that she felt a  prominence more severe along the right side of her posterior right ear.  She is concerned as it radiates down.  She does report some worsening pain with any movement and rotation of the neck.  Some concern for MSK versus postauricular lymph nodes noted.  She does not have any active ear infection, no signs of otitis on my exam.  We discussed close follow-up with primary care physician which she currently has an appointment with next month.  She is agreeable to plan and treatment.   I discussed this case with my attending Dr. Gayleen Orem who has seen patient and agrees with plan and treatment.   Portions of this note were generated with Scientist, clinical (histocompatibility and immunogenetics). Dictation errors may occur despite best attempts at proofreading.   Final Clinical Impression(s) / ED Diagnoses Final diagnoses:  Posterior auricular pain of right ear    Rx / DC Orders ED Discharge Orders     None         Claude Manges, PA-C 12/17/22 1650    Loetta Rough, MD 12/18/22 0009

## 2022-12-17 NOTE — Discharge Instructions (Signed)
Please follow up with your primary care physician.

## 2022-12-17 NOTE — ED Triage Notes (Signed)
C/O pain on right posterior ear area; stated always been bigger than the other side side and last night she had some throbbing pains radiating to right shoulder.
